# Patient Record
Sex: Male | Born: 1970 | Race: White | Hispanic: No | Marital: Married
Health system: Southern US, Community
[De-identification: ages and names within clinical notes are randomized; demographics above are authoritative.]

---

## 2009-01-21 ENCOUNTER — Encounter: Admission: RE | Admit: 2009-01-21 | Discharge: 2009-01-21 | Payer: Self-pay | Admitting: Cardiology

## 2016-05-04 ENCOUNTER — Other Ambulatory Visit: Payer: Self-pay | Admitting: Internal Medicine

## 2016-05-04 DIAGNOSIS — R221 Localized swelling, mass and lump, neck: Secondary | ICD-10-CM

## 2016-05-05 ENCOUNTER — Inpatient Hospital Stay
Admission: RE | Admit: 2016-05-05 | Discharge: 2016-05-05 | Disposition: A | Discharge status: Home / Self Care | Payer: Self-pay | Source: Ambulatory Visit | Attending: Internal Medicine | Admitting: Internal Medicine

## 2016-05-10 ENCOUNTER — Ambulatory Visit
Admission: RE | Admit: 2016-05-10 | Discharge: 2016-05-10 | Disposition: A | Discharge status: Home / Self Care | Payer: BLUE CROSS/BLUE SHIELD | Source: Ambulatory Visit | Attending: Internal Medicine | Admitting: Internal Medicine

## 2016-05-10 DIAGNOSIS — R221 Localized swelling, mass and lump, neck: Secondary | ICD-10-CM

## 2017-05-24 DIAGNOSIS — H6691 Otitis media, unspecified, right ear: Secondary | ICD-10-CM | POA: Diagnosis not present

## 2017-05-30 DIAGNOSIS — H35033 Hypertensive retinopathy, bilateral: Secondary | ICD-10-CM | POA: Diagnosis not present

## 2017-05-30 DIAGNOSIS — H34812 Central retinal vein occlusion, left eye, with macular edema: Secondary | ICD-10-CM | POA: Diagnosis not present

## 2017-05-30 DIAGNOSIS — H3562 Retinal hemorrhage, left eye: Secondary | ICD-10-CM | POA: Diagnosis not present

## 2017-05-31 DIAGNOSIS — H348122 Central retinal vein occlusion, left eye, stable: Secondary | ICD-10-CM | POA: Diagnosis not present

## 2017-05-31 DIAGNOSIS — F419 Anxiety disorder, unspecified: Secondary | ICD-10-CM | POA: Diagnosis not present

## 2017-05-31 DIAGNOSIS — I10 Essential (primary) hypertension: Secondary | ICD-10-CM | POA: Diagnosis not present

## 2017-06-12 ENCOUNTER — Other Ambulatory Visit (HOSPITAL_COMMUNITY)
Admission: RE | Admit: 2017-06-12 | Discharge: 2017-06-12 | Disposition: A | Discharge status: Home / Self Care | Payer: BLUE CROSS/BLUE SHIELD | Source: Ambulatory Visit | Attending: Ophthalmology | Admitting: Ophthalmology

## 2017-06-12 DIAGNOSIS — H34812 Central retinal vein occlusion, left eye, with macular edema: Secondary | ICD-10-CM | POA: Diagnosis not present

## 2017-06-12 LAB — PROTIME-INR
INR: 1.04
PROTHROMBIN TIME: 13.5 s (ref 11.4–15.2)

## 2017-06-12 LAB — URINALYSIS, ROUTINE W REFLEX MICROSCOPIC
BILIRUBIN URINE: NEGATIVE
Glucose, UA: NEGATIVE mg/dL
HGB URINE DIPSTICK: NEGATIVE
Ketones, ur: 5 mg/dL — AB
Leukocytes, UA: NEGATIVE
Nitrite: NEGATIVE
PH: 7 (ref 5.0–8.0)
Protein, ur: NEGATIVE mg/dL
SPECIFIC GRAVITY, URINE: 1.008 (ref 1.005–1.030)

## 2017-06-12 LAB — APTT: aPTT: 29 seconds (ref 24–36)

## 2017-06-13 LAB — HOMOCYSTEINE: Homocysteine: 10.9 umol/L (ref 0.0–15.0)

## 2017-06-14 LAB — ANTIPHOSPHOLIPID SYNDROME EVAL, BLD
Anticardiolipin IgA: <9 APL U/mL (ref 0–11)
Anticardiolipin IgM: <9 MPL U/mL (ref 0–12)
DRVVT: 37.2 s (ref 0.0–47.0)
PTT Lupus Anticoagulant: 31.2 s (ref 0.0–51.9)
Phosphatydalserine, IgA: 1 APS IgA (ref 0–20)
Phosphatydalserine, IgG: 2 GPS IgG (ref 0–11)
Phosphatydalserine, IgM: 15 MPS IgM (ref 0–25)

## 2017-06-21 DIAGNOSIS — H3562 Retinal hemorrhage, left eye: Secondary | ICD-10-CM | POA: Diagnosis not present

## 2017-06-21 DIAGNOSIS — H2513 Age-related nuclear cataract, bilateral: Secondary | ICD-10-CM | POA: Diagnosis not present

## 2017-06-21 DIAGNOSIS — H34812 Central retinal vein occlusion, left eye, with macular edema: Secondary | ICD-10-CM | POA: Diagnosis not present

## 2017-06-21 DIAGNOSIS — H3582 Retinal ischemia: Secondary | ICD-10-CM | POA: Diagnosis not present

## 2017-07-04 LAB — FACTOR 5 LEIDEN

## 2017-07-12 DIAGNOSIS — H34812 Central retinal vein occlusion, left eye, with macular edema: Secondary | ICD-10-CM | POA: Diagnosis not present

## 2017-08-09 DIAGNOSIS — H34812 Central retinal vein occlusion, left eye, with macular edema: Secondary | ICD-10-CM | POA: Diagnosis not present

## 2017-08-09 DIAGNOSIS — H2513 Age-related nuclear cataract, bilateral: Secondary | ICD-10-CM | POA: Diagnosis not present

## 2017-08-09 DIAGNOSIS — H3582 Retinal ischemia: Secondary | ICD-10-CM | POA: Diagnosis not present

## 2017-09-08 DIAGNOSIS — H65191 Other acute nonsuppurative otitis media, right ear: Secondary | ICD-10-CM | POA: Diagnosis not present

## 2017-09-12 DIAGNOSIS — E063 Autoimmune thyroiditis: Secondary | ICD-10-CM | POA: Diagnosis not present

## 2017-09-12 DIAGNOSIS — Z8349 Family history of other endocrine, nutritional and metabolic diseases: Secondary | ICD-10-CM | POA: Diagnosis not present

## 2017-10-04 DIAGNOSIS — H2513 Age-related nuclear cataract, bilateral: Secondary | ICD-10-CM | POA: Diagnosis not present

## 2017-10-04 DIAGNOSIS — H34812 Central retinal vein occlusion, left eye, with macular edema: Secondary | ICD-10-CM | POA: Diagnosis not present

## 2017-12-13 DIAGNOSIS — H43821 Vitreomacular adhesion, right eye: Secondary | ICD-10-CM | POA: Diagnosis not present

## 2017-12-13 DIAGNOSIS — H3582 Retinal ischemia: Secondary | ICD-10-CM | POA: Diagnosis not present

## 2017-12-13 DIAGNOSIS — H34812 Central retinal vein occlusion, left eye, with macular edema: Secondary | ICD-10-CM | POA: Diagnosis not present

## 2018-03-06 DIAGNOSIS — H2513 Age-related nuclear cataract, bilateral: Secondary | ICD-10-CM | POA: Diagnosis not present

## 2018-03-06 DIAGNOSIS — H43821 Vitreomacular adhesion, right eye: Secondary | ICD-10-CM | POA: Diagnosis not present

## 2018-03-06 DIAGNOSIS — H3582 Retinal ischemia: Secondary | ICD-10-CM | POA: Diagnosis not present

## 2018-03-06 DIAGNOSIS — H34812 Central retinal vein occlusion, left eye, with macular edema: Secondary | ICD-10-CM | POA: Diagnosis not present

## 2018-03-23 DIAGNOSIS — M25562 Pain in left knee: Secondary | ICD-10-CM | POA: Diagnosis not present

## 2018-04-02 DIAGNOSIS — Z23 Encounter for immunization: Secondary | ICD-10-CM | POA: Diagnosis not present

## 2018-04-02 DIAGNOSIS — E7800 Pure hypercholesterolemia, unspecified: Secondary | ICD-10-CM | POA: Diagnosis not present

## 2018-04-02 DIAGNOSIS — E559 Vitamin D deficiency, unspecified: Secondary | ICD-10-CM | POA: Diagnosis not present

## 2018-04-02 DIAGNOSIS — Z0000 Encounter for general adult medical examination without abnormal findings: Secondary | ICD-10-CM | POA: Diagnosis not present

## 2018-04-02 DIAGNOSIS — Z131 Encounter for screening for diabetes mellitus: Secondary | ICD-10-CM | POA: Diagnosis not present

## 2018-04-02 DIAGNOSIS — I10 Essential (primary) hypertension: Secondary | ICD-10-CM | POA: Diagnosis not present

## 2018-04-05 DIAGNOSIS — M25562 Pain in left knee: Secondary | ICD-10-CM | POA: Diagnosis not present

## 2018-04-13 DIAGNOSIS — M25562 Pain in left knee: Secondary | ICD-10-CM | POA: Diagnosis not present

## 2018-06-12 DIAGNOSIS — H3582 Retinal ischemia: Secondary | ICD-10-CM | POA: Diagnosis not present

## 2018-06-12 DIAGNOSIS — H43821 Vitreomacular adhesion, right eye: Secondary | ICD-10-CM | POA: Diagnosis not present

## 2018-06-12 DIAGNOSIS — H2513 Age-related nuclear cataract, bilateral: Secondary | ICD-10-CM | POA: Diagnosis not present

## 2018-06-12 DIAGNOSIS — H34812 Central retinal vein occlusion, left eye, with macular edema: Secondary | ICD-10-CM | POA: Diagnosis not present

## 2018-08-16 IMAGING — US US SOFT TISSUE HEAD/NECK
1 series · 9 of 9 positions shown · non-contrast
Comparison: None.

CLINICAL DATA: Palpable painless left submandibular lesion x6
months , no trauma

EXAM:
ULTRASOUND OF HEAD/NECK SOFT TISSUES
TECHNIQUE: Ultrasound examination of the head and neck soft tissues was
performed in the area of clinical concern.

[Series 1: us soft tissue head/neck · 0.06mm/px · 9 of 9 slices shown]
[im 1/9]
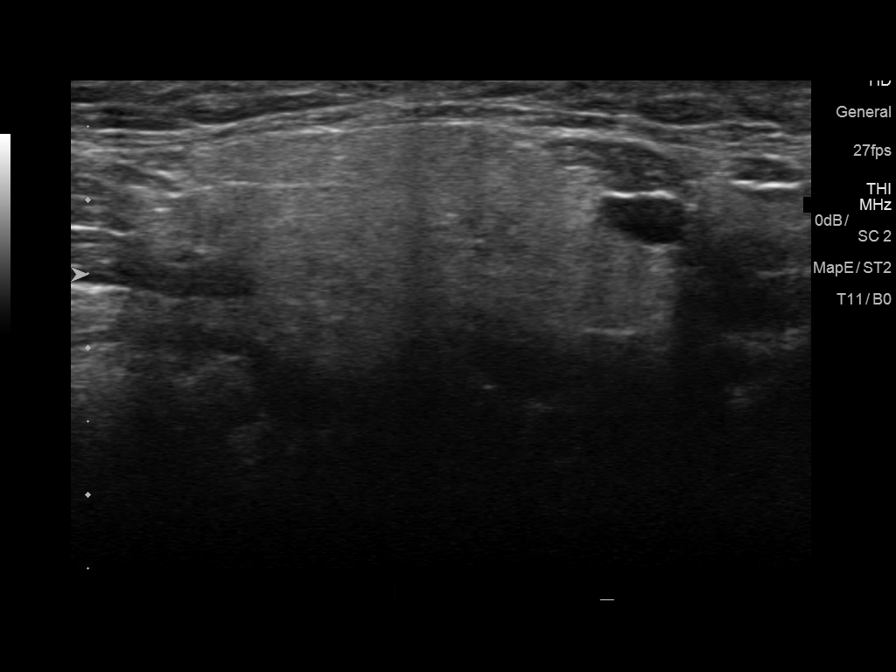
[im 2/9]
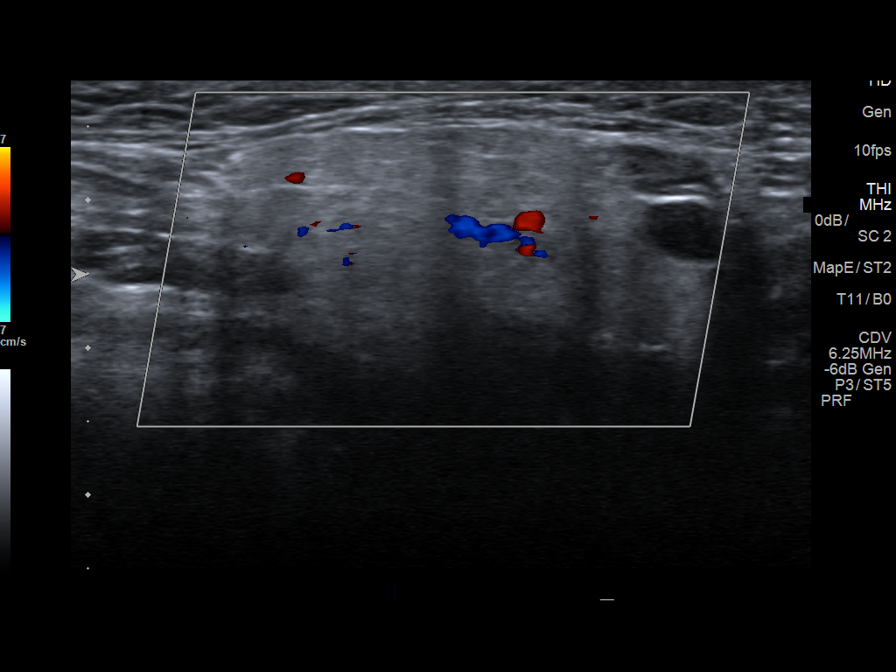
[im 3/9]
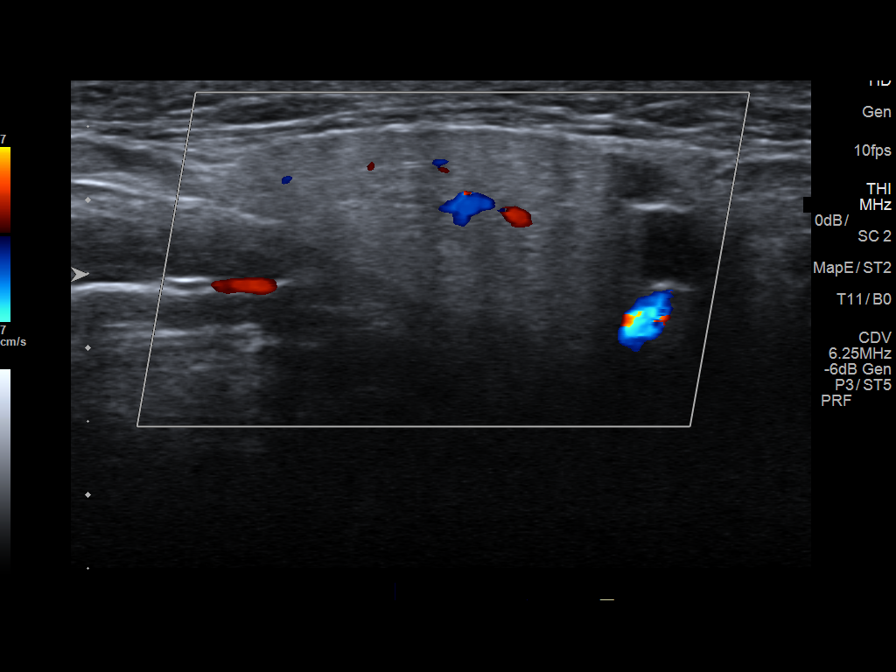
[im 4/9]
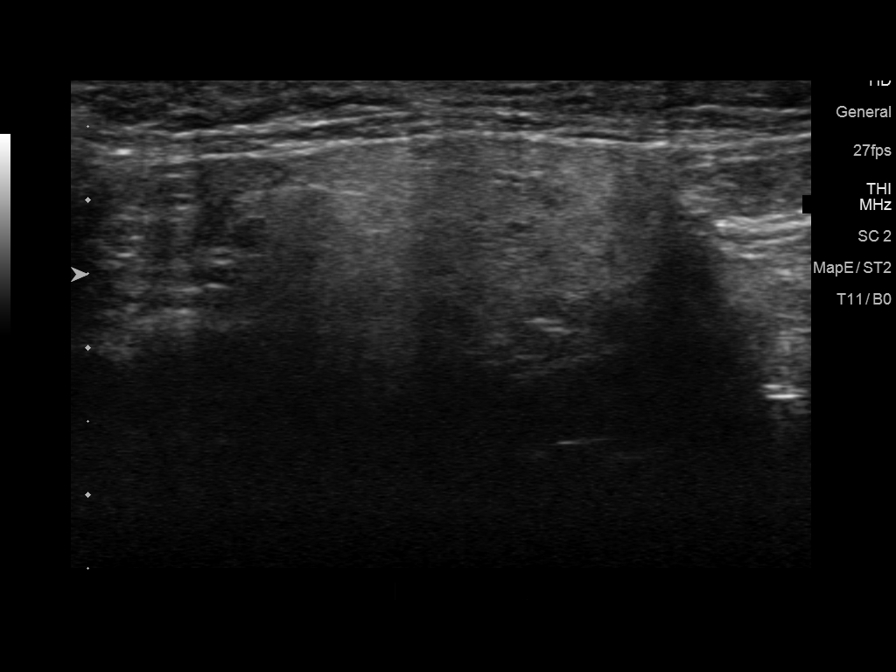
[im 5/9]
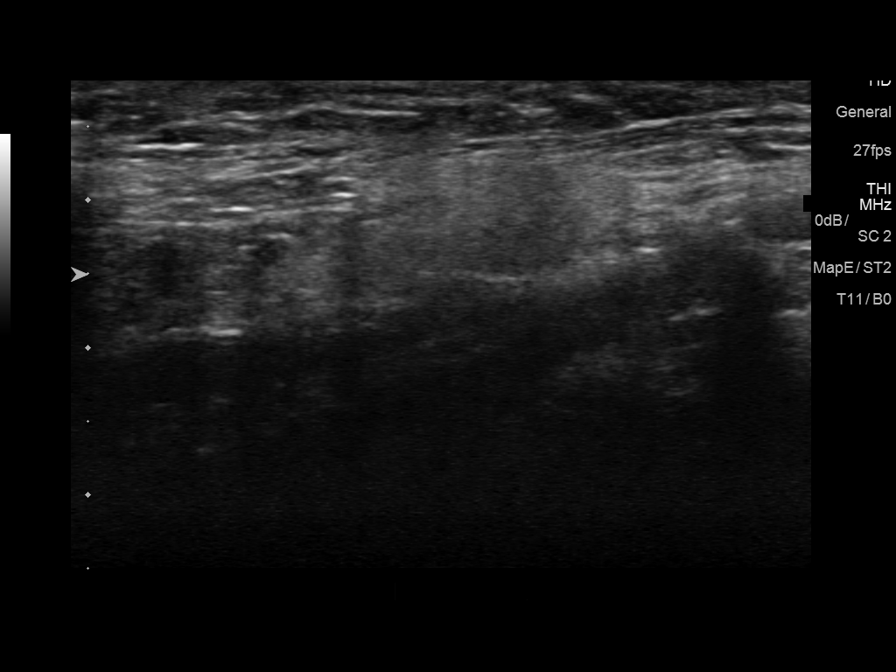
[im 6/9]
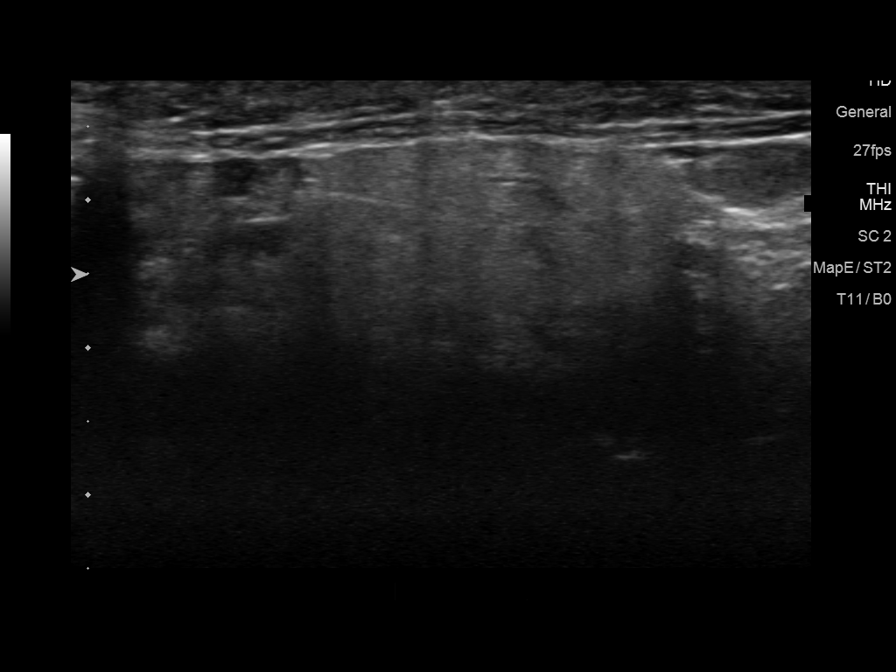
[im 7/9]
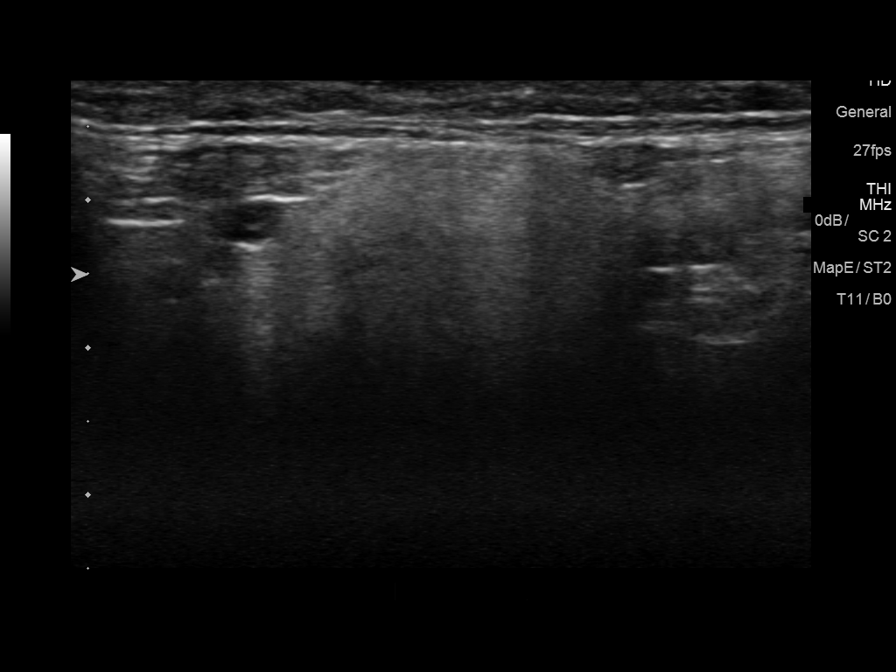
[im 8/9]
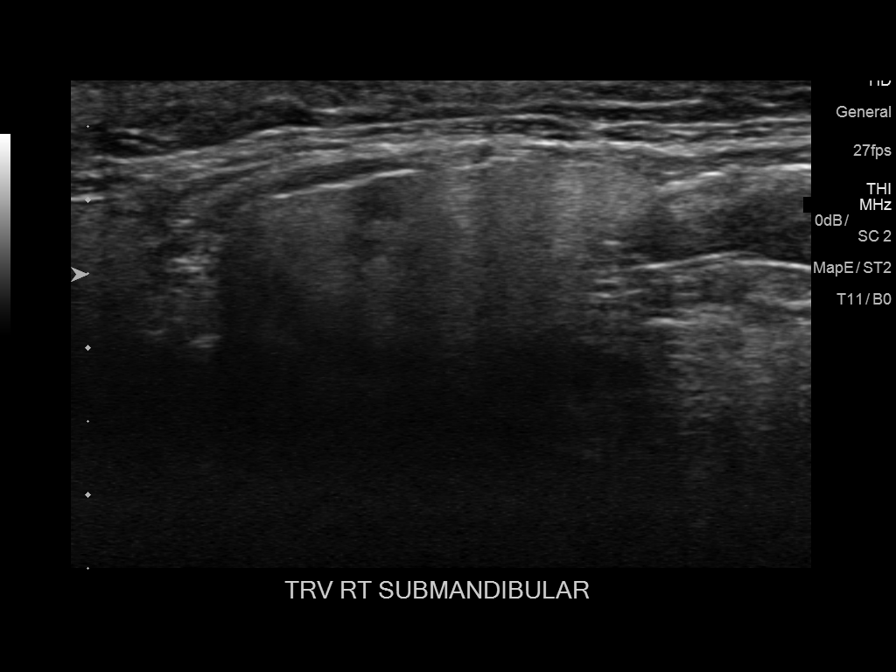
[im 9/9]
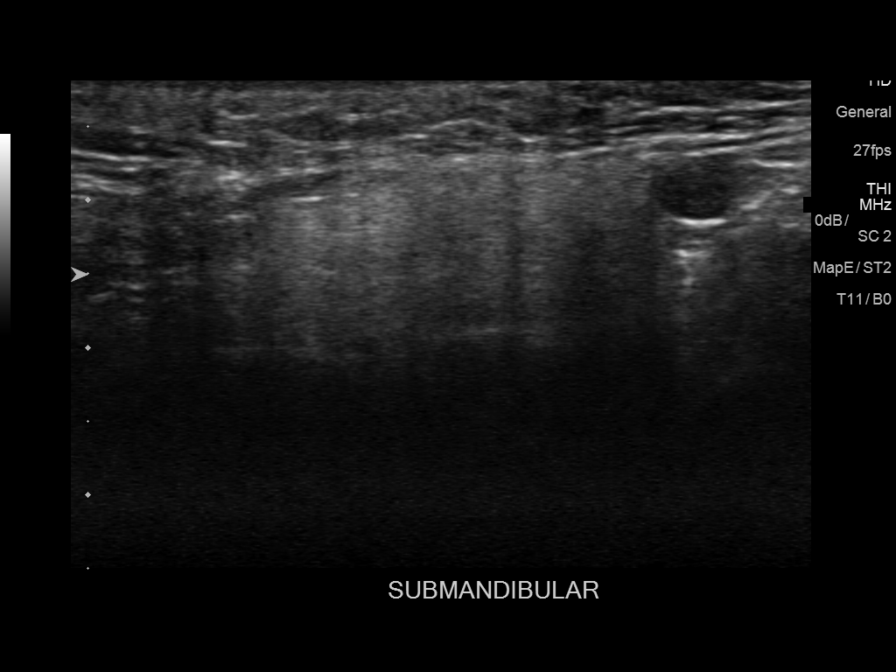

[9 of 9 positions shown; findings below may reference images not displayed]

FINDINGS: The left submandibular gland is unremarkable. Contralateral images
of the right submandibular gland for comparison are similarly
unremarkable. No mass, cyst, adenopathy, abscess, aneurysm, or other
pathologic finding.
IMPRESSION: Normal symmetric submandibular glands.  No pathologic findings.

## 2018-10-02 DIAGNOSIS — H43821 Vitreomacular adhesion, right eye: Secondary | ICD-10-CM | POA: Diagnosis not present

## 2018-10-02 DIAGNOSIS — Z8349 Family history of other endocrine, nutritional and metabolic diseases: Secondary | ICD-10-CM | POA: Diagnosis not present

## 2018-10-02 DIAGNOSIS — H3582 Retinal ischemia: Secondary | ICD-10-CM | POA: Diagnosis not present

## 2018-10-02 DIAGNOSIS — H34812 Central retinal vein occlusion, left eye, with macular edema: Secondary | ICD-10-CM | POA: Diagnosis not present

## 2018-10-02 DIAGNOSIS — H2513 Age-related nuclear cataract, bilateral: Secondary | ICD-10-CM | POA: Diagnosis not present

## 2018-10-02 DIAGNOSIS — E063 Autoimmune thyroiditis: Secondary | ICD-10-CM | POA: Diagnosis not present

## 2019-01-01 DIAGNOSIS — H3582 Retinal ischemia: Secondary | ICD-10-CM | POA: Diagnosis not present

## 2019-01-01 DIAGNOSIS — H34812 Central retinal vein occlusion, left eye, with macular edema: Secondary | ICD-10-CM | POA: Diagnosis not present

## 2019-01-23 DIAGNOSIS — J302 Other seasonal allergic rhinitis: Secondary | ICD-10-CM | POA: Diagnosis not present

## 2019-01-23 DIAGNOSIS — H66001 Acute suppurative otitis media without spontaneous rupture of ear drum, right ear: Secondary | ICD-10-CM | POA: Diagnosis not present

## 2019-03-26 DIAGNOSIS — H2513 Age-related nuclear cataract, bilateral: Secondary | ICD-10-CM | POA: Diagnosis not present

## 2019-03-26 DIAGNOSIS — H43821 Vitreomacular adhesion, right eye: Secondary | ICD-10-CM | POA: Diagnosis not present

## 2019-03-26 DIAGNOSIS — H3582 Retinal ischemia: Secondary | ICD-10-CM | POA: Diagnosis not present

## 2019-03-26 DIAGNOSIS — H348122 Central retinal vein occlusion, left eye, stable: Secondary | ICD-10-CM | POA: Diagnosis not present

## 2019-04-09 DIAGNOSIS — I10 Essential (primary) hypertension: Secondary | ICD-10-CM | POA: Diagnosis not present

## 2019-04-09 DIAGNOSIS — Z131 Encounter for screening for diabetes mellitus: Secondary | ICD-10-CM | POA: Diagnosis not present

## 2019-04-09 DIAGNOSIS — E7800 Pure hypercholesterolemia, unspecified: Secondary | ICD-10-CM | POA: Diagnosis not present

## 2019-04-09 DIAGNOSIS — E559 Vitamin D deficiency, unspecified: Secondary | ICD-10-CM | POA: Diagnosis not present

## 2019-04-09 DIAGNOSIS — Z0000 Encounter for general adult medical examination without abnormal findings: Secondary | ICD-10-CM | POA: Diagnosis not present

## 2019-07-17 DIAGNOSIS — H2513 Age-related nuclear cataract, bilateral: Secondary | ICD-10-CM | POA: Diagnosis not present

## 2019-07-17 DIAGNOSIS — H43821 Vitreomacular adhesion, right eye: Secondary | ICD-10-CM | POA: Diagnosis not present

## 2019-07-17 DIAGNOSIS — H348122 Central retinal vein occlusion, left eye, stable: Secondary | ICD-10-CM | POA: Diagnosis not present

## 2019-07-17 DIAGNOSIS — H3582 Retinal ischemia: Secondary | ICD-10-CM | POA: Diagnosis not present

## 2019-07-23 DIAGNOSIS — H04123 Dry eye syndrome of bilateral lacrimal glands: Secondary | ICD-10-CM | POA: Diagnosis not present

## 2019-07-30 DIAGNOSIS — H34812 Central retinal vein occlusion, left eye, with macular edema: Secondary | ICD-10-CM | POA: Diagnosis not present

## 2019-07-30 DIAGNOSIS — H3562 Retinal hemorrhage, left eye: Secondary | ICD-10-CM | POA: Diagnosis not present

## 2019-07-30 DIAGNOSIS — H3582 Retinal ischemia: Secondary | ICD-10-CM | POA: Diagnosis not present

## 2019-10-02 DIAGNOSIS — H43821 Vitreomacular adhesion, right eye: Secondary | ICD-10-CM | POA: Diagnosis not present

## 2019-10-02 DIAGNOSIS — H3562 Retinal hemorrhage, left eye: Secondary | ICD-10-CM | POA: Diagnosis not present

## 2019-10-02 DIAGNOSIS — H34812 Central retinal vein occlusion, left eye, with macular edema: Secondary | ICD-10-CM | POA: Diagnosis not present

## 2019-10-03 DIAGNOSIS — Z8349 Family history of other endocrine, nutritional and metabolic diseases: Secondary | ICD-10-CM | POA: Diagnosis not present

## 2019-10-03 DIAGNOSIS — E063 Autoimmune thyroiditis: Secondary | ICD-10-CM | POA: Diagnosis not present

## 2019-12-24 DIAGNOSIS — H43821 Vitreomacular adhesion, right eye: Secondary | ICD-10-CM | POA: Diagnosis not present

## 2019-12-24 DIAGNOSIS — H3562 Retinal hemorrhage, left eye: Secondary | ICD-10-CM | POA: Diagnosis not present

## 2019-12-24 DIAGNOSIS — H3582 Retinal ischemia: Secondary | ICD-10-CM | POA: Diagnosis not present

## 2019-12-24 DIAGNOSIS — H34812 Central retinal vein occlusion, left eye, with macular edema: Secondary | ICD-10-CM | POA: Diagnosis not present

## 2020-01-22 DIAGNOSIS — Z0000 Encounter for general adult medical examination without abnormal findings: Secondary | ICD-10-CM | POA: Diagnosis not present

## 2020-01-22 DIAGNOSIS — E559 Vitamin D deficiency, unspecified: Secondary | ICD-10-CM | POA: Diagnosis not present

## 2020-01-22 DIAGNOSIS — I10 Essential (primary) hypertension: Secondary | ICD-10-CM | POA: Diagnosis not present

## 2020-01-22 DIAGNOSIS — E063 Autoimmune thyroiditis: Secondary | ICD-10-CM | POA: Diagnosis not present

## 2020-01-22 DIAGNOSIS — E7800 Pure hypercholesterolemia, unspecified: Secondary | ICD-10-CM | POA: Diagnosis not present

## 2020-01-22 DIAGNOSIS — R5383 Other fatigue: Secondary | ICD-10-CM | POA: Diagnosis not present

## 2020-01-22 DIAGNOSIS — Z131 Encounter for screening for diabetes mellitus: Secondary | ICD-10-CM | POA: Diagnosis not present

## 2020-02-18 DIAGNOSIS — H3582 Retinal ischemia: Secondary | ICD-10-CM | POA: Diagnosis not present

## 2020-02-18 DIAGNOSIS — H34812 Central retinal vein occlusion, left eye, with macular edema: Secondary | ICD-10-CM | POA: Diagnosis not present

## 2020-02-18 DIAGNOSIS — H43821 Vitreomacular adhesion, right eye: Secondary | ICD-10-CM | POA: Diagnosis not present

## 2020-02-18 DIAGNOSIS — H3562 Retinal hemorrhage, left eye: Secondary | ICD-10-CM | POA: Diagnosis not present

## 2020-05-06 DIAGNOSIS — H34812 Central retinal vein occlusion, left eye, with macular edema: Secondary | ICD-10-CM | POA: Diagnosis not present

## 2020-05-06 DIAGNOSIS — H3582 Retinal ischemia: Secondary | ICD-10-CM | POA: Diagnosis not present

## 2020-05-06 DIAGNOSIS — H2513 Age-related nuclear cataract, bilateral: Secondary | ICD-10-CM | POA: Diagnosis not present

## 2020-07-07 DIAGNOSIS — H34812 Central retinal vein occlusion, left eye, with macular edema: Secondary | ICD-10-CM | POA: Diagnosis not present

## 2020-07-07 DIAGNOSIS — H43821 Vitreomacular adhesion, right eye: Secondary | ICD-10-CM | POA: Diagnosis not present

## 2020-11-24 ENCOUNTER — Other Ambulatory Visit: Payer: Self-pay

## 2020-11-24 ENCOUNTER — Emergency Department
Admission: EM | Admit: 2020-11-24 | Discharge: 2020-11-24 | Disposition: A | Discharge status: Home / Self Care | Payer: BLUE CROSS/BLUE SHIELD | Attending: Emergency Medicine | Admitting: Emergency Medicine

## 2020-11-24 DIAGNOSIS — M542 Cervicalgia: Secondary | ICD-10-CM | POA: Insufficient documentation

## 2020-11-24 DIAGNOSIS — Z5321 Procedure and treatment not carried out due to patient leaving prior to being seen by health care provider: Secondary | ICD-10-CM | POA: Insufficient documentation

## 2020-11-24 DIAGNOSIS — Y92410 Unspecified street and highway as the place of occurrence of the external cause: Secondary | ICD-10-CM | POA: Insufficient documentation

## 2020-11-24 DIAGNOSIS — M545 Low back pain, unspecified: Secondary | ICD-10-CM | POA: Insufficient documentation

## 2020-11-24 NOTE — ED Notes (Addendum)
No answer when called several times from lobby; no answer when phone # listed in chart called 

## 2020-11-24 NOTE — ED Notes (Signed)
No answer when called several times from lobby 
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
## 2020-11-24 NOTE — ED Notes (Addendum)
No answer when called several times from lobby 

## 2022-04-13 LAB — COLOGUARD: COLOGUARD: NEGATIVE

## 2024-07-26 ENCOUNTER — Ambulatory Visit: Admitting: Family
